# Patient Record
Sex: Female | Born: 1975 | Hispanic: Yes | Smoking: Never smoker
Health system: Southern US, Community
[De-identification: ages and names within clinical notes are randomized; demographics above are authoritative.]

## PROBLEM LIST (undated history)

## (undated) DIAGNOSIS — E349 Endocrine disorder, unspecified: Secondary | ICD-10-CM

## (undated) HISTORY — DX: Endocrine disorder, unspecified: E34.9

---

## 2005-07-25 ENCOUNTER — Ambulatory Visit: Payer: Self-pay | Admitting: Family Medicine

## 2007-03-30 IMAGING — MG UNKNOWN MG STUDY
1 series · 4 of 4 positions shown · non-contrast
Comparison: none

REASON FOR EXAM: [facility] SCREENING MAMMOGRAM
COMMENTS:

PROCEDURE:     [facility] SCRN MAMMO DIGITAL /CAD  - July 25, 2005  [DATE]
RESULT:     No prior films are available for comparison.
The breasts are dense with no dominant masses seen. No suspicious
microcalcifications are noted. No areas of architectural distortion are
identified.

[Series 2336: R CC · right · 4 of 4 slices shown]
[im 1/4]
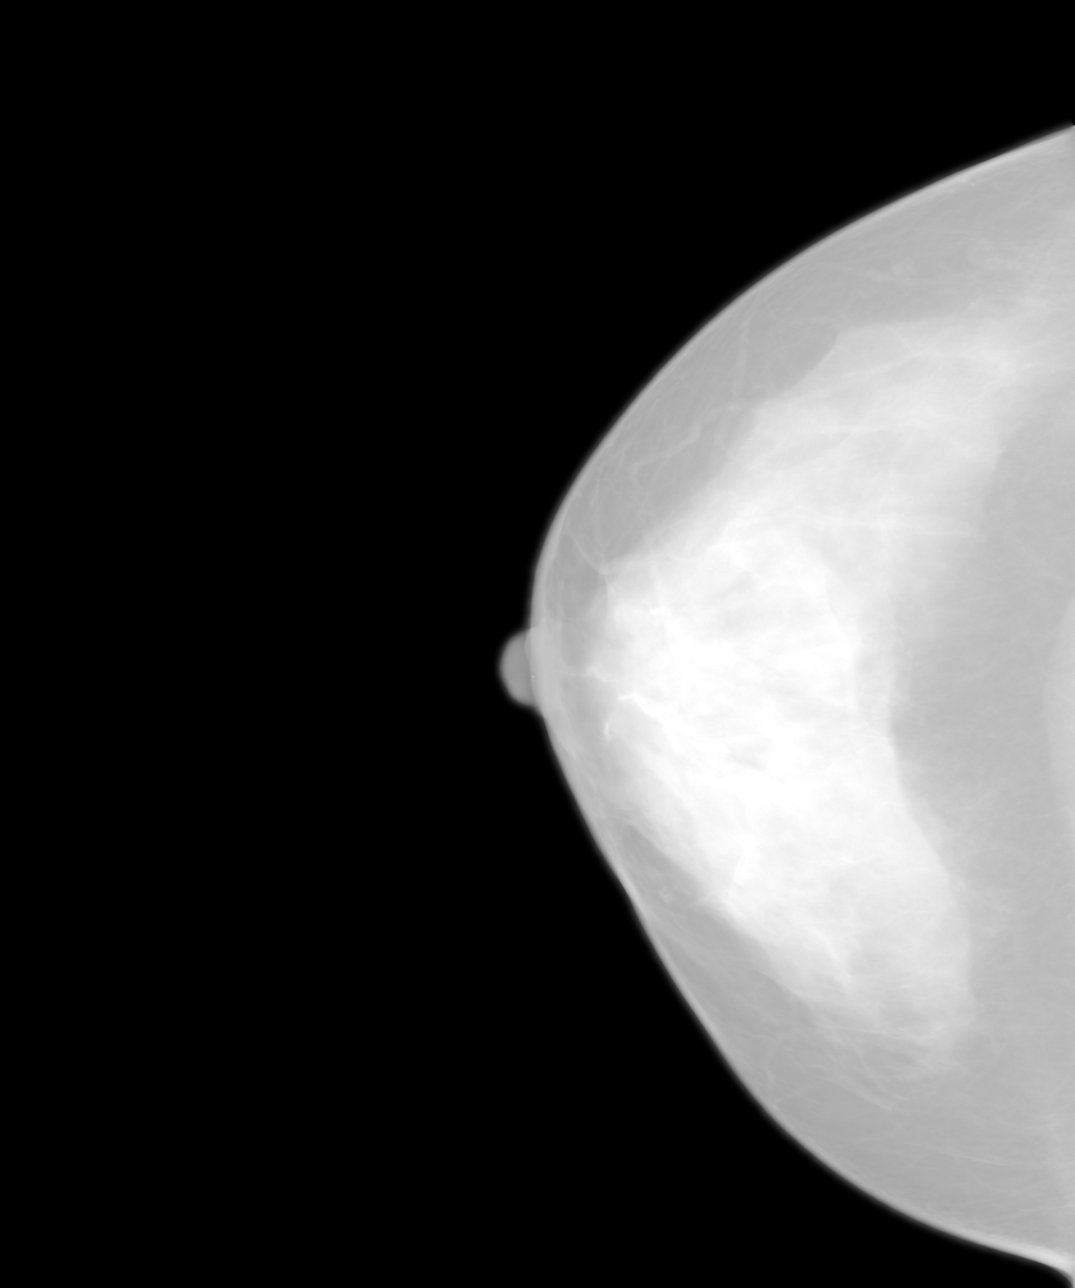
[im 2/4]
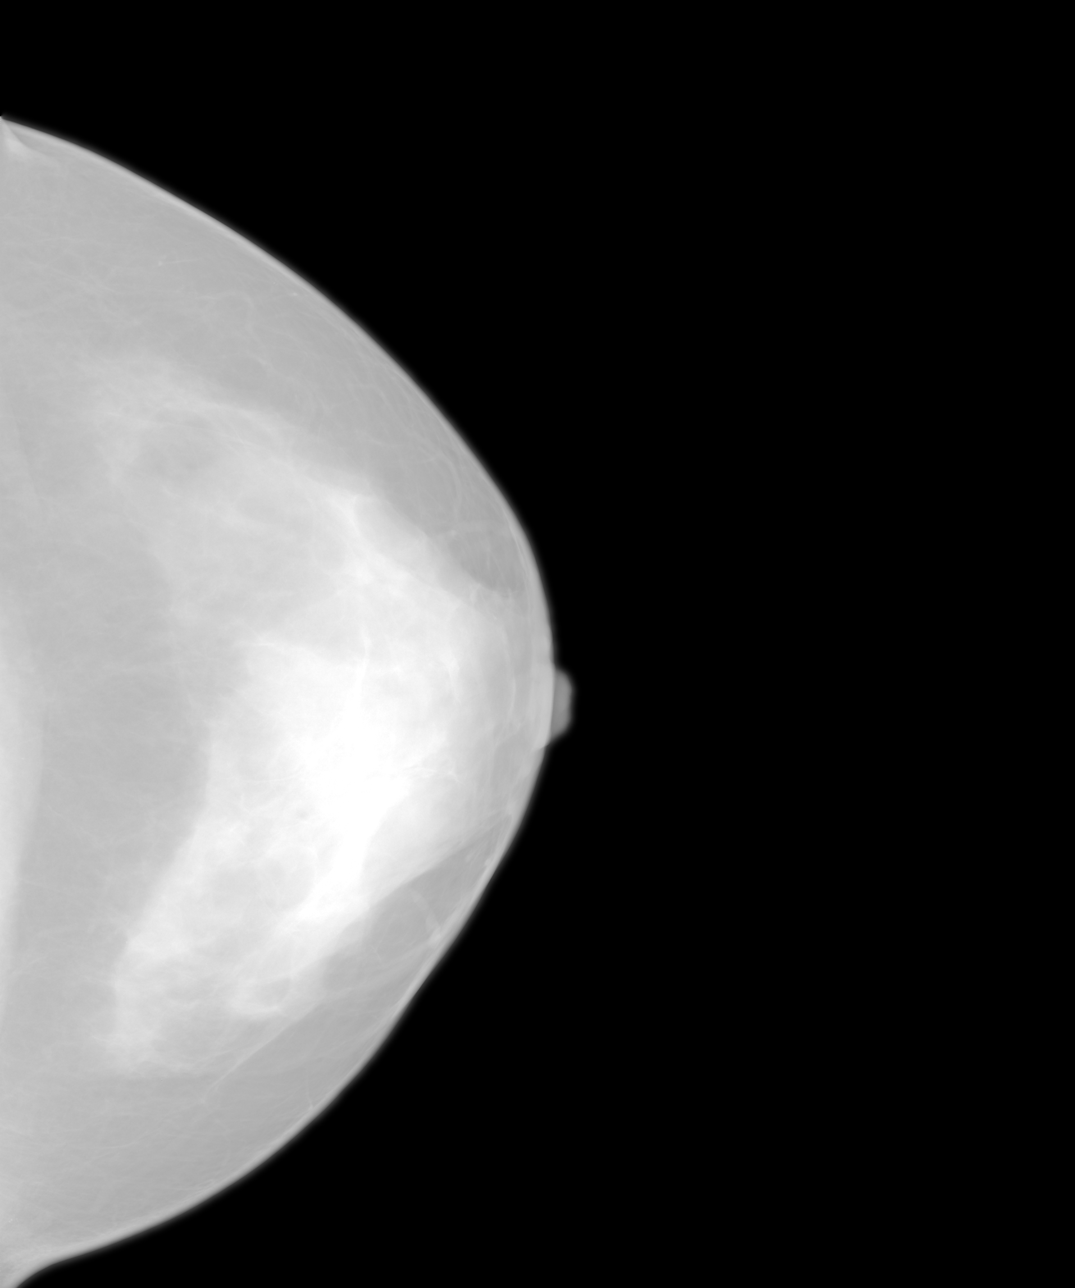
[im 3/4]
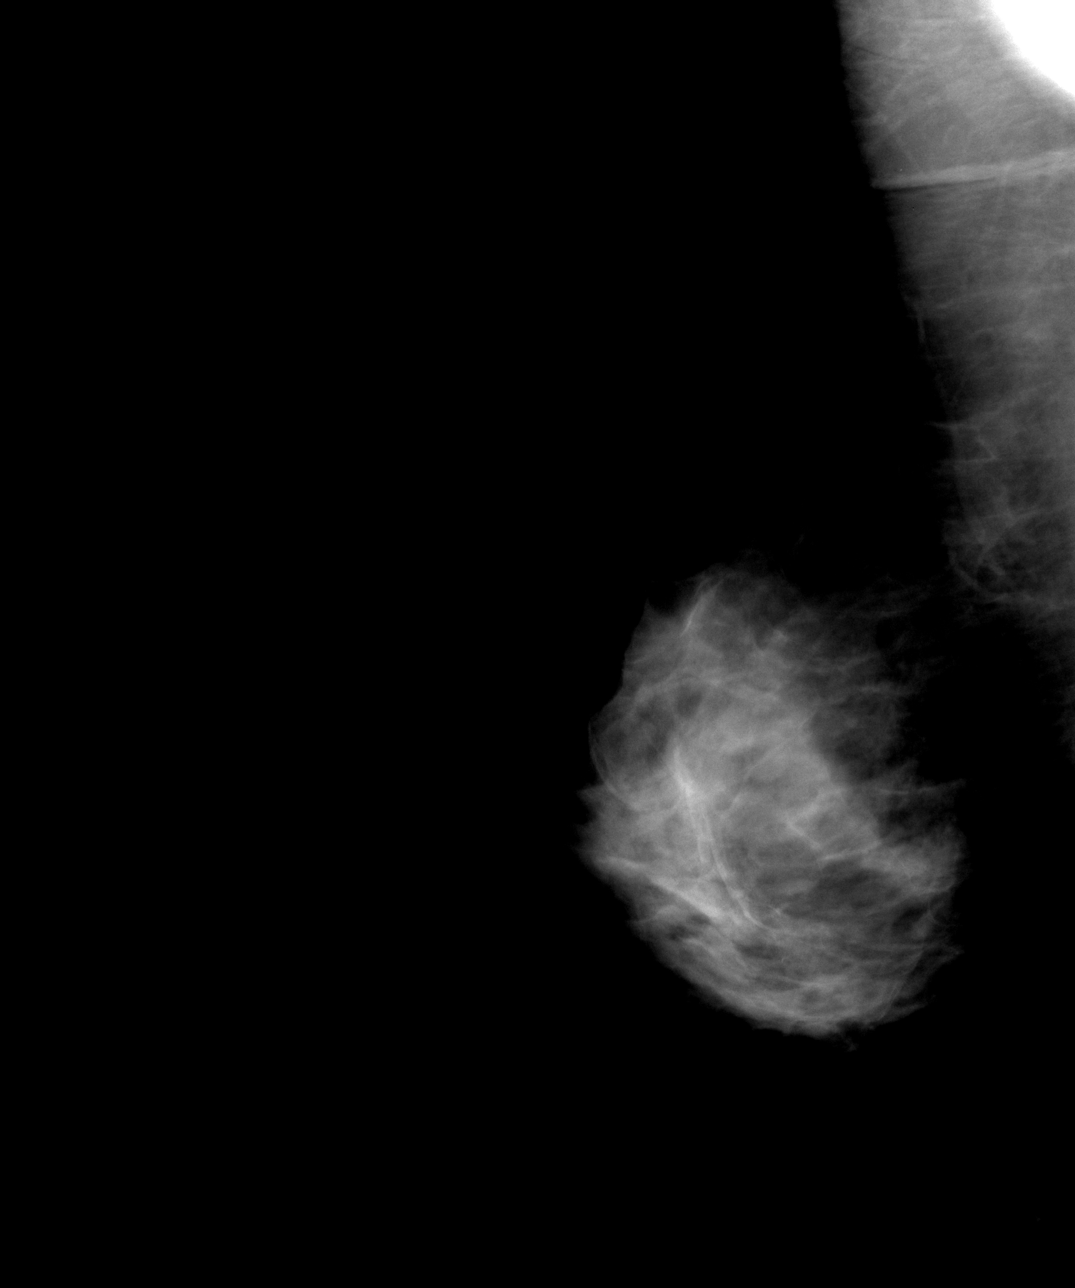
[im 4/4]
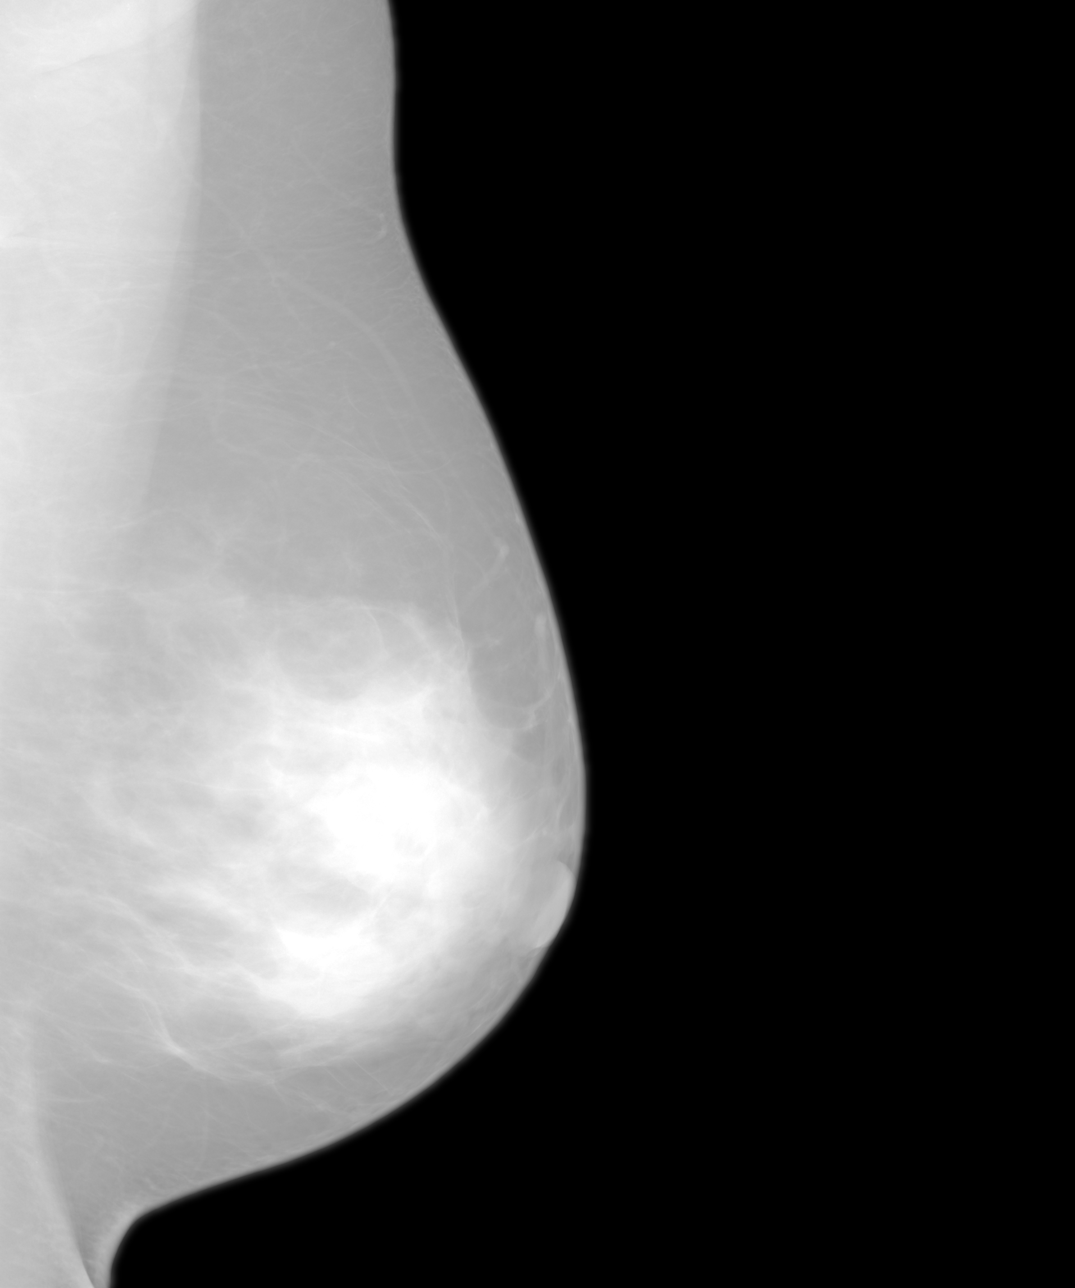

[4 of 4 positions shown; findings below may reference images not displayed]

IMPRESSION: Dense breasts. Continued annual follow-up is recommended.

BI-RADS:  Category 2 - Benign Imaging Findings

Thank you for this opportunity to contribute to the care of your patient.

A NEGATIVE MAMMOGRAM REPORT DOES NOT PRECLUDE BIOPSY OR OTHER EVALUATION OF
CLINICALLY PALPABLE OR OTHERWISE SUSPICIOUS MASS OR LESION.  BREAST CANCER
MAY NOT BE DETECTED BY MAMMOGRAPHY IN UP TO 10% OF CASES.

## 2013-05-21 ENCOUNTER — Emergency Department: Payer: Self-pay | Admitting: Emergency Medicine

## 2013-05-21 LAB — COMPREHENSIVE METABOLIC PANEL
ALBUMIN: 3.9 g/dL (ref 3.4–5.0)
Alkaline Phosphatase: 58 U/L
Anion Gap: 7 (ref 7–16)
BUN: 12 mg/dL (ref 7–18)
Bilirubin,Total: 0.6 mg/dL (ref 0.2–1.0)
CALCIUM: 9 mg/dL (ref 8.5–10.1)
CHLORIDE: 105 mmol/L (ref 98–107)
CREATININE: 0.77 mg/dL (ref 0.60–1.30)
Co2: 25 mmol/L (ref 21–32)
EGFR (African American): >60
EGFR (Non-African Amer.): >60
GLUCOSE: 104 mg/dL — AB (ref 65–99)
Osmolality: 274 (ref 275–301)
Potassium: 4 mmol/L (ref 3.5–5.1)
SGOT(AST): 15 U/L (ref 15–37)
SGPT (ALT): 23 U/L (ref 12–78)
SODIUM: 137 mmol/L (ref 136–145)
Total Protein: 8 g/dL (ref 6.4–8.2)

## 2013-05-21 LAB — URINALYSIS, COMPLETE
BILIRUBIN, UR: NEGATIVE
GLUCOSE, UR: NEGATIVE mg/dL (ref 0–75)
Leukocyte Esterase: NEGATIVE
Nitrite: NEGATIVE
PH: 5 (ref 4.5–8.0)
RBC,UR: 2175 /HPF (ref 0–5)
SPECIFIC GRAVITY: 1.028 (ref 1.003–1.030)
Squamous Epithelial: 2
WBC UR: 46 /HPF (ref 0–5)

## 2013-05-21 LAB — CBC
HCT: 41.9 % (ref 35.0–47.0)
HGB: 14.2 g/dL (ref 12.0–16.0)
MCH: 30.2 pg (ref 26.0–34.0)
MCHC: 33.9 g/dL (ref 32.0–36.0)
MCV: 89 fL (ref 80–100)
Platelet: 220 10*3/uL (ref 150–440)
RBC: 4.71 10*6/uL (ref 3.80–5.20)
RDW: 12.5 % (ref 11.5–14.5)
WBC: 15.5 10*3/uL — ABNORMAL HIGH (ref 3.6–11.0)

## 2013-05-21 LAB — HCG, QUANTITATIVE, PREGNANCY: BETA HCG, QUANT.: 2425 m[IU]/mL — AB

## 2013-05-21 LAB — WET PREP, GENITAL

## 2013-05-22 LAB — GC/CHLAMYDIA PROBE AMP

## 2015-01-24 IMAGING — US US OB < 14 WEEKS - US OB TV
1 series · 14 of 28 positions shown · non-contrast
Comparison: None.

CLINICAL DATA: Abdomen pain, pregnant

EXAM:
OBSTETRIC <14 WK US AND TRANSVAGINAL OB US
TECHNIQUE: Both transabdominal and transvaginal ultrasound examinations were
performed for complete evaluation of the gestation as well as the
maternal uterus, adnexal regions, and pelvic cul-de-sac.
Transvaginal technique was performed to assess early pregnancy.

[Series 1: us ob < 14 weeks - us ob tv · 0.18mm/px · 14 of 52 slices shown]
[im 2/52]
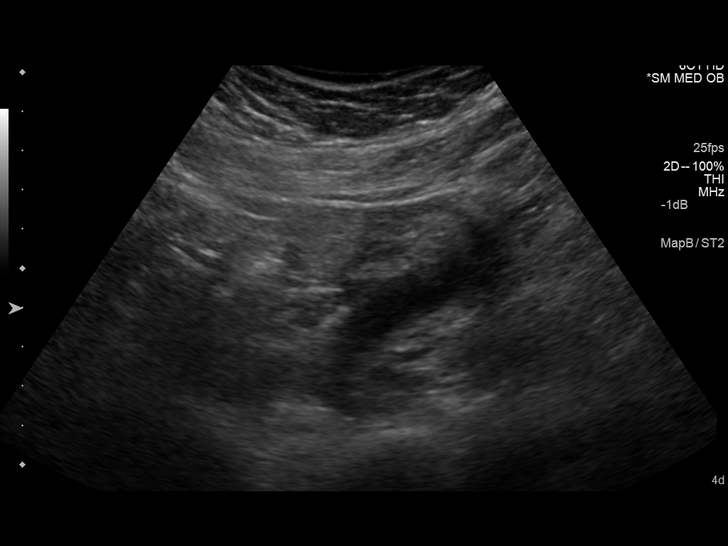
[im 6/52]
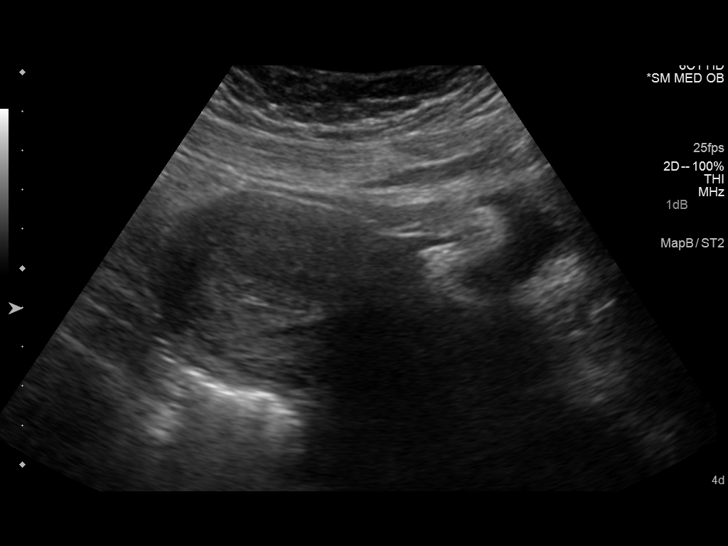
[im 10/52]
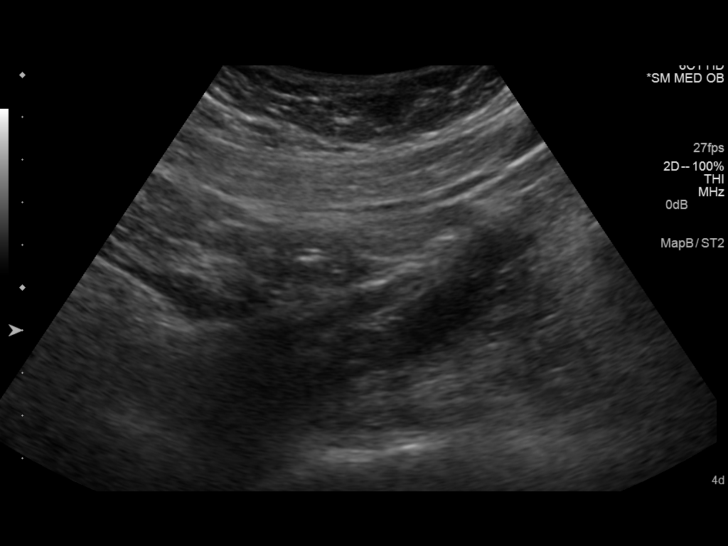
[im 14/52]
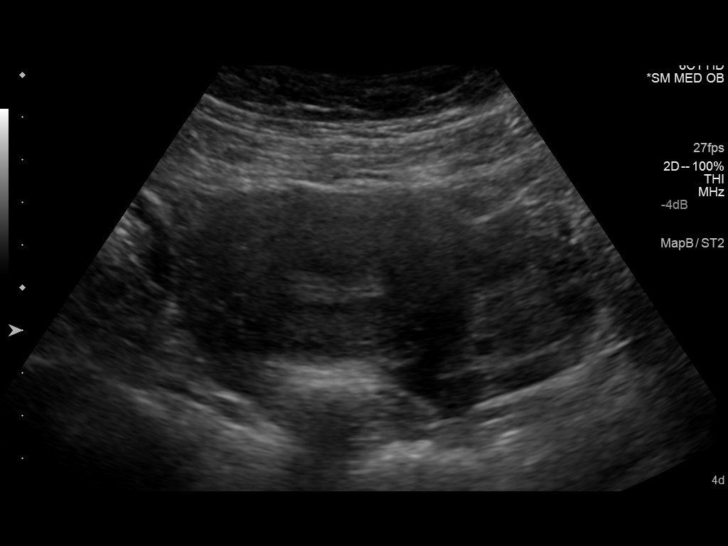
[im 18/52]
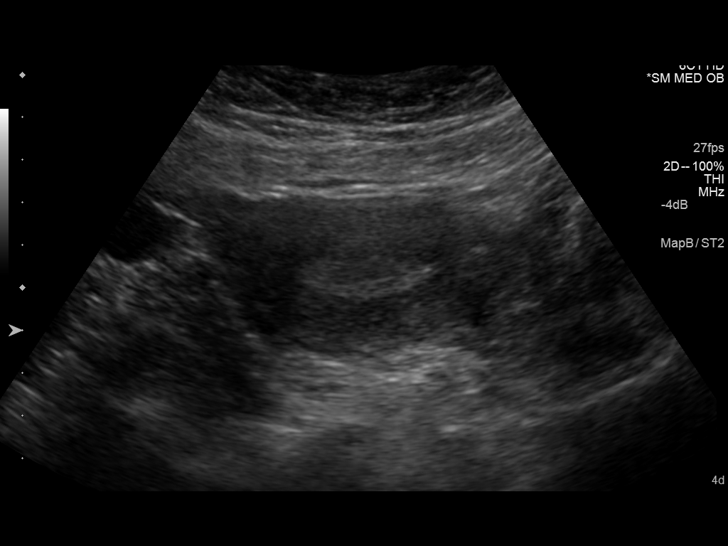
[im 21/52]
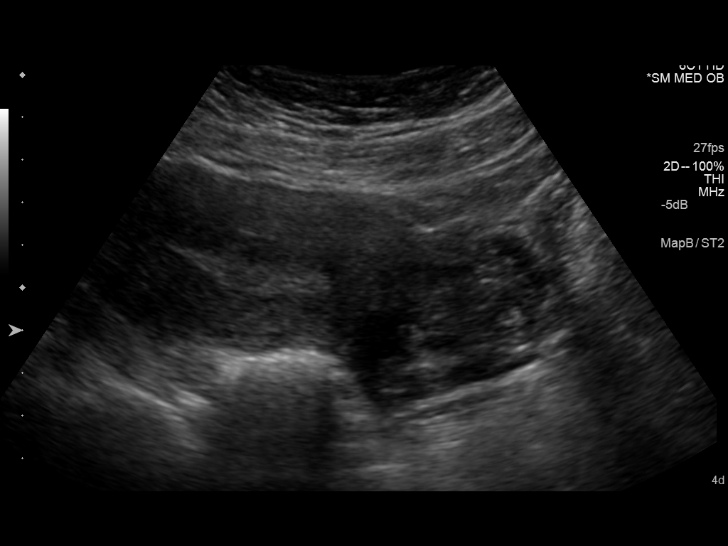
[im 25/52]
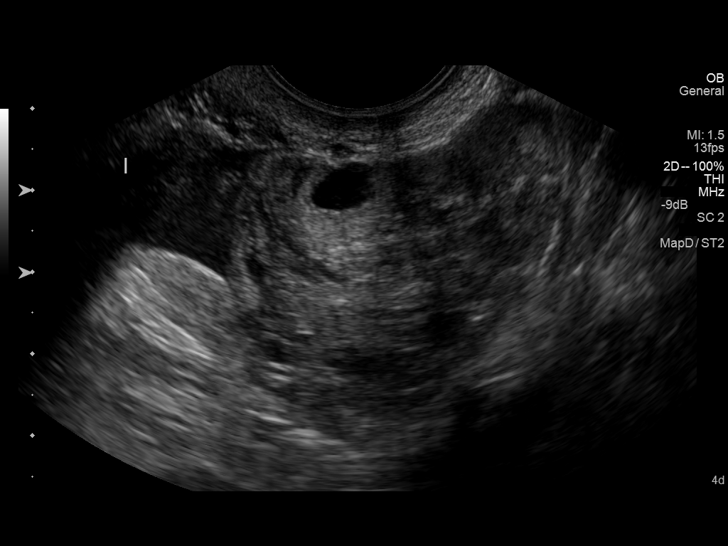
[im 29/52]
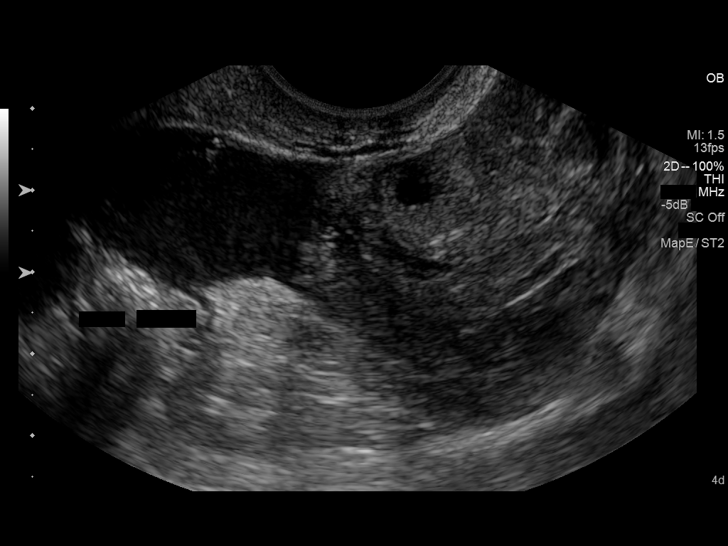
[im 33/52]
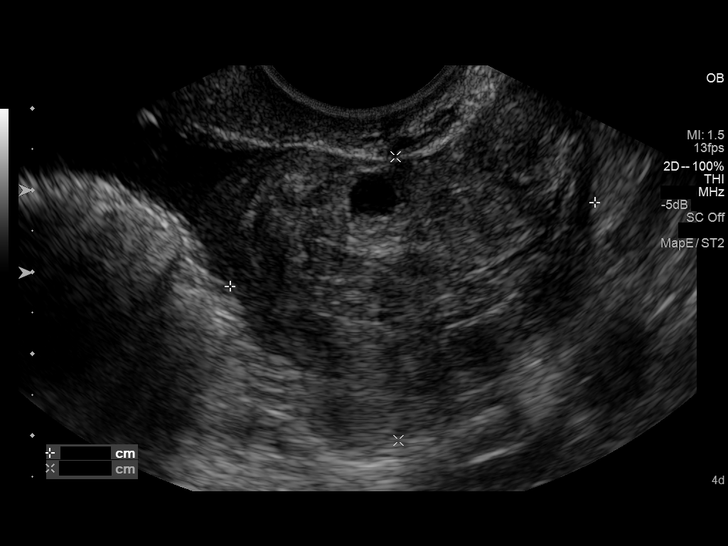
[im 36/52]
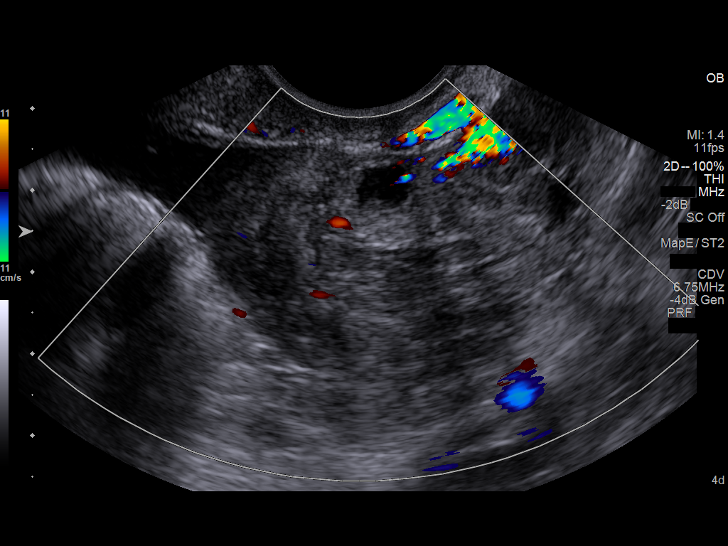
[im 40/52]
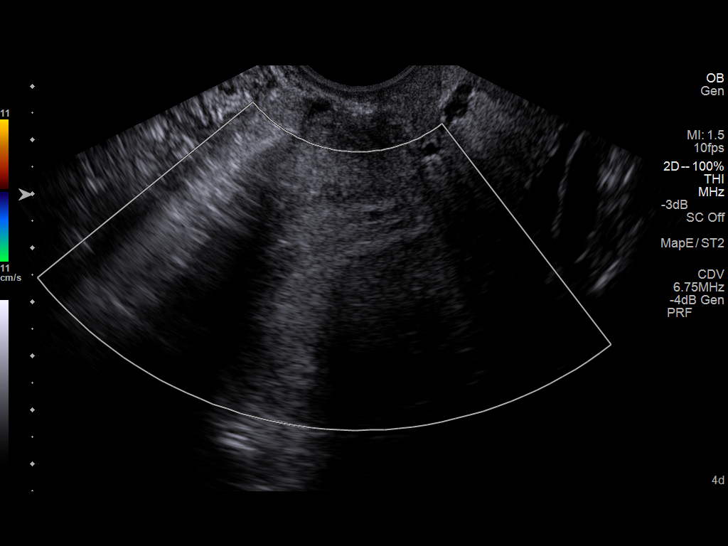
[im 44/52]
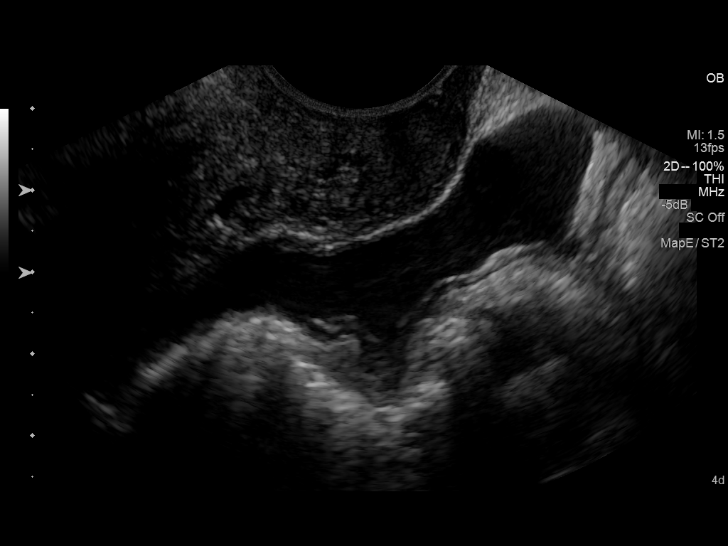
[im 48/52]
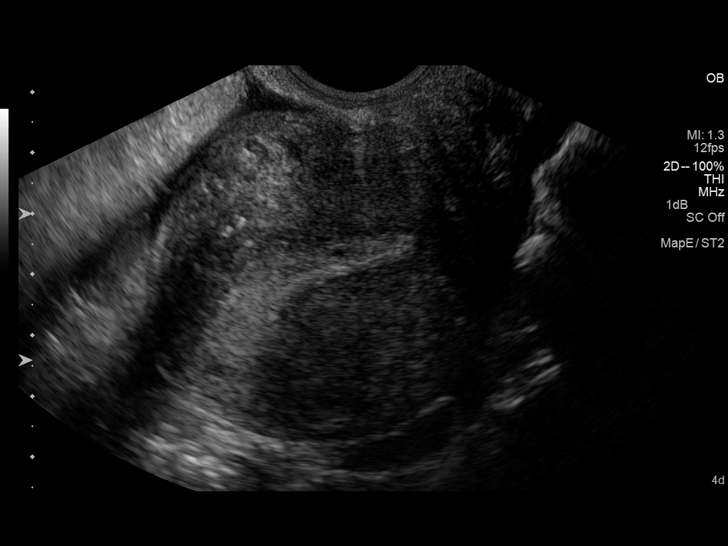
[im 52/52]
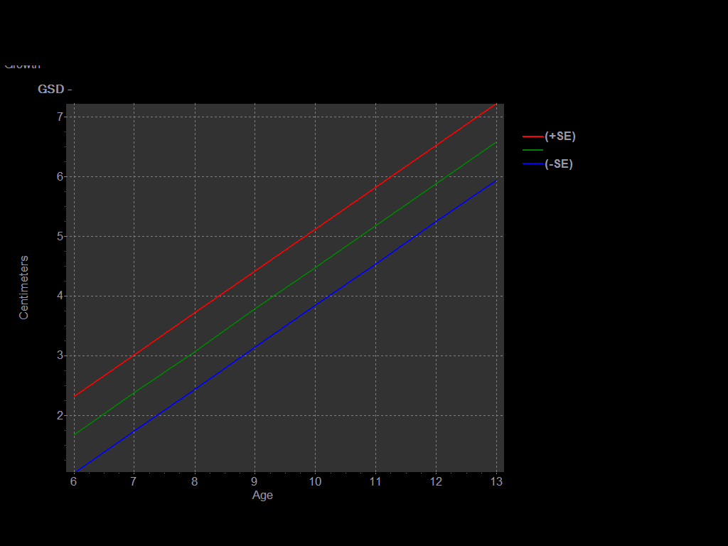

[14 of 28 positions shown; findings below may reference images not displayed]

FINDINGS: Intrauterine gestational sac: Not present

Yolk sac:  Not present

Embryo:  Not present

Cardiac Activity: Not present

Heart Rate:  Not applicable bpm

Maternal uterus/adnexae: The right ovary is not visualized. Moderate
free fluid is identified in the pelvis. In the left adnexa, there is
complex echotexture area measuring 4.95 x 4.57 x 3.46 cm with
central small anechoic area, possible gestational sac.
IMPRESSION: No intrauterine gestational sac identified. Complex echotexture
masslike area in the left adnexa with possible gestational sac
suspicious for ectopic pregnancy.

Critical Value/emergent results were called by telephone at the time
of interpretation on 05/21/2013 at [DATE] to Dr. TADEO RANKINS ,
who verbally acknowledged these results.

## 2015-10-07 DIAGNOSIS — O09529 Supervision of elderly multigravida, unspecified trimester: Secondary | ICD-10-CM | POA: Insufficient documentation

## 2015-10-14 DIAGNOSIS — D352 Benign neoplasm of pituitary gland: Secondary | ICD-10-CM | POA: Insufficient documentation

## 2015-10-15 DIAGNOSIS — E76219 Morquio mucopolysaccharidoses, unspecified: Secondary | ICD-10-CM | POA: Insufficient documentation

## 2020-05-01 ENCOUNTER — Emergency Department: Admission: EM | Admit: 2020-05-01 | Discharge: 2020-05-01 | Discharge status: Against Medical Advice | Payer: Self-pay

## 2020-05-01 ENCOUNTER — Other Ambulatory Visit: Payer: Self-pay

## 2021-07-14 ENCOUNTER — Other Ambulatory Visit: Payer: Self-pay

## 2021-07-14 DIAGNOSIS — Z1211 Encounter for screening for malignant neoplasm of colon: Secondary | ICD-10-CM

## 2021-07-20 ENCOUNTER — Ambulatory Visit: Payer: Self-pay | Attending: Hematology and Oncology | Admitting: *Deleted

## 2021-07-20 ENCOUNTER — Other Ambulatory Visit: Payer: Self-pay

## 2021-07-20 ENCOUNTER — Ambulatory Visit
Admission: RE | Admit: 2021-07-20 | Discharge: 2021-07-20 | Disposition: A | Discharge status: Home / Self Care | Payer: Self-pay | Source: Ambulatory Visit | Attending: Obstetrics and Gynecology | Admitting: Obstetrics and Gynecology

## 2021-07-20 VITALS — BP 113/84 | HR 92 | Resp 18 | Wt 164.0 lb

## 2021-07-20 DIAGNOSIS — Z1239 Encounter for other screening for malignant neoplasm of breast: Secondary | ICD-10-CM

## 2021-07-20 DIAGNOSIS — Z1231 Encounter for screening mammogram for malignant neoplasm of breast: Secondary | ICD-10-CM | POA: Insufficient documentation

## 2021-07-20 NOTE — Progress Notes (Signed)
[name] is a 46 y.o. female who presents to [facility] clinic today with no complaints.  ?  ?Pap Smear: Pap smear not completed today. Last Pap smear was October 2022 at [facility] clinic and was normal per patient. Per patient has no history of an abnormal Pap smear. Last Pap smear result is not available in Epic. ?  ?Physical exam: ?Breasts ?Breasts symmetrical. No skin abnormalities bilateral breasts. No nipple retraction bilateral breasts. No nipple discharge bilateral breasts. No lymphadenopathy. No lumps palpated bilateral breasts. No complaints of pain or tenderness on exam.    ? ?Pelvic/Bimanual ?Pap is not indicated today per BCCCP guidelines. ?  ?Smoking History: ?Patient has never smoked. ?  ?Patient Navigation: ?Patient education provided. Access to services provided for patient through [employer] program. Spanish interpreter Aundra Millet from [facility] provided.  ? ?Colorectal Cancer Screening: ?Per patient has never had colonoscopy completed. FIT Test given to patient to complete. No complaints today.  ?  ?Breast and Cervical Cancer Risk Assessment: ?Patient does not have family history of breast cancer, known genetic mutations, or radiation treatment to the chest before age 74. Patient does not have history of cervical dysplasia, immunocompromised, or DES exposure in-utero. ? ?Risk Assessment   ? ? Risk Scores   ? ?   07/20/2021  ? Last edited by: Drue Dun, RN  ? 5-year risk: 0.8 %  ? Lifetime risk: 7.2 %  ? ?  ?  ? ?  ? ? ?A: ?BCCCP exam without pap smear ?No complaints. ? ?P: ?Referred patient to the [facility] for a screening mammogram. Appointment scheduled Tuesday, July 20, 2021 at 1420. ? ?Marie Parish, RN ?07/20/2021 2:10 PM   ?

## 2021-07-20 NOTE — Patient Instructions (Signed)
Explained breast self awareness with [name]. Patient did not need a Pap smear today due to last Pap smear was in October 2022 per patient. Let her know BCCCP will cover Pap smears every 3 years unless has a history of abnormal Pap smears. Referred patient to the [facility] for a screening mammogram. Appointment scheduled Tuesday, July 20, 2021 at 1420. Patient aware of appointment and will be there. Let patient know [name] will follow up with her within the next couple weeks with results of mammogram by letter or phone. [name] verbalized understanding. ? ?Marie Singleton, [name], RN ?2:10 PM ? ? ? ? ?

## 2021-11-23 ENCOUNTER — Encounter: Payer: Self-pay | Admitting: Obstetrics & Gynecology

## 2022-02-08 ENCOUNTER — Other Ambulatory Visit: Payer: Self-pay | Admitting: Family Medicine

## 2022-02-08 DIAGNOSIS — K5900 Constipation, unspecified: Secondary | ICD-10-CM
